# Patient Record
Sex: Female | Born: 2000 | Race: White | Hispanic: No | Marital: Single | Smoking: Never smoker
Health system: Southern US, Community
[De-identification: ages and names within clinical notes are randomized; demographics above are authoritative.]

## PROBLEM LIST (undated history)

## (undated) DIAGNOSIS — R109 Unspecified abdominal pain: Secondary | ICD-10-CM

## (undated) DIAGNOSIS — J45909 Unspecified asthma, uncomplicated: Secondary | ICD-10-CM

## (undated) DIAGNOSIS — Z8489 Family history of other specified conditions: Secondary | ICD-10-CM

## (undated) DIAGNOSIS — R51 Headache: Secondary | ICD-10-CM

## (undated) DIAGNOSIS — Z9889 Other specified postprocedural states: Secondary | ICD-10-CM

## (undated) DIAGNOSIS — S42309A Unspecified fracture of shaft of humerus, unspecified arm, initial encounter for closed fracture: Secondary | ICD-10-CM

## (undated) DIAGNOSIS — R112 Nausea with vomiting, unspecified: Secondary | ICD-10-CM

## (undated) DIAGNOSIS — J189 Pneumonia, unspecified organism: Secondary | ICD-10-CM

## (undated) DIAGNOSIS — N211 Calculus in urethra: Secondary | ICD-10-CM

## (undated) DIAGNOSIS — K625 Hemorrhage of anus and rectum: Secondary | ICD-10-CM

## (undated) DIAGNOSIS — R519 Headache, unspecified: Secondary | ICD-10-CM

## (undated) HISTORY — PX: BRONCHOSCOPY: SUR163

## (undated) HISTORY — PX: WISDOM TOOTH EXTRACTION: SHX21

---
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
## 2019-01-22 DIAGNOSIS — N200 Calculus of kidney: Secondary | ICD-10-CM | POA: Diagnosis not present

## 2020-09-06 IMAGING — CT CT ABD-PELV W/ CM
2 of 4 series · 15 of 46 positions shown, 17 images · IV contrast (APPLIED)
Comparison: Pelvic and appendix ultrasound 3 hours prior.

CLINICAL DATA: Nausea, vomiting, and right lower quadrant abdominal
pain.

EXAM:
CT ABDOMEN AND PELVIS WITH CONTRAST
TECHNIQUE: Multidetector CT imaging of the abdomen and pelvis was performed
using the standard protocol following bolus administration of
intravenous contrast.
CONTRAST:  100mL OMNIPAQUE IOHEXOL 300 MG/ML  SOLN

[Series 3: abdomen 5.0 · axial · 0.69mm/px · z∈[+744,+1159]mm · 12 of 93 slices shown, 14 images]
[im 5/93  soft-tissue]
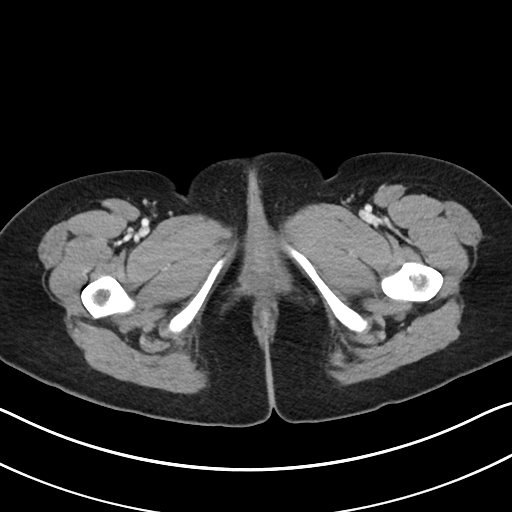
[im 5/93  bone]
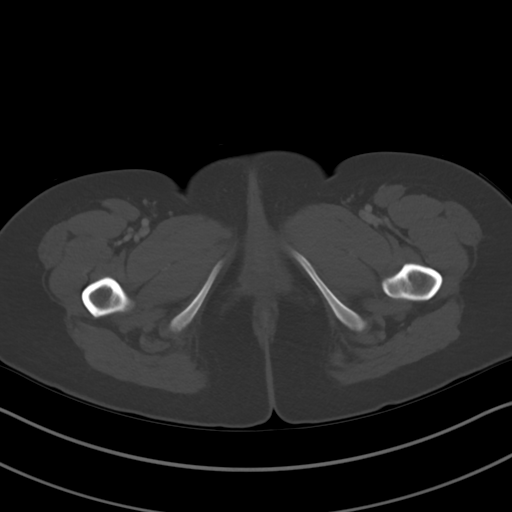
[im 14/93  soft-tissue]
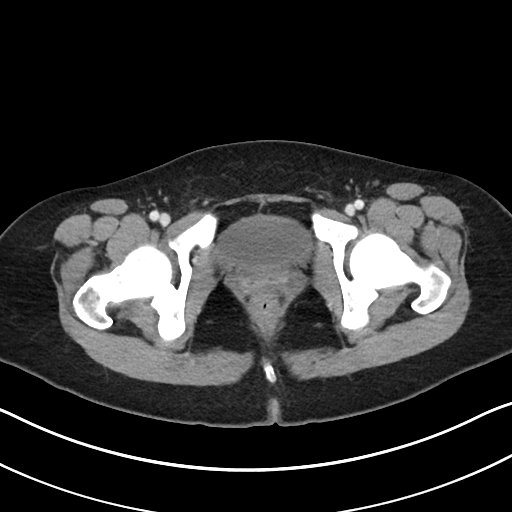
[im 22/93  soft-tissue]
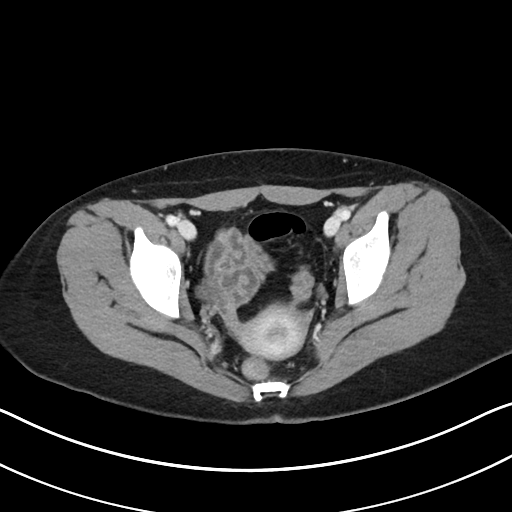
[im 27/93  soft-tissue]
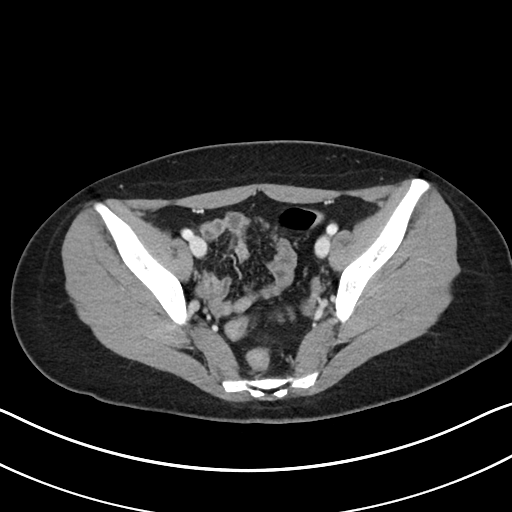
[im 36/93  soft-tissue]
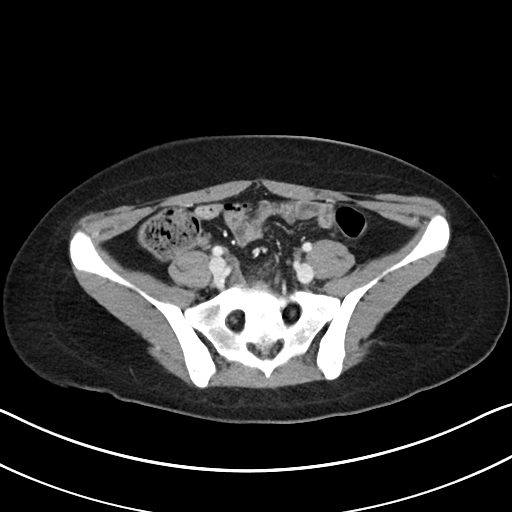
[im 44/93  soft-tissue]
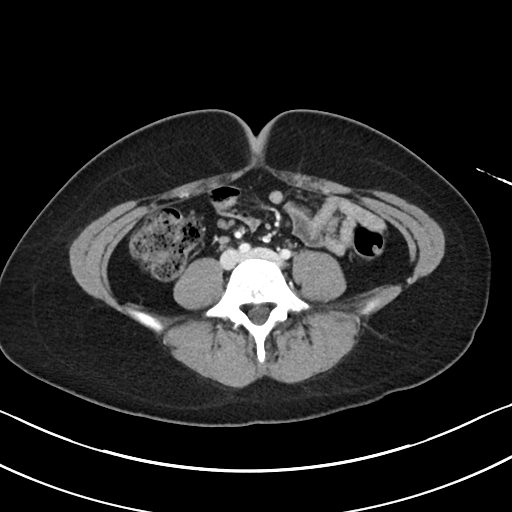
[im 49/93  soft-tissue]
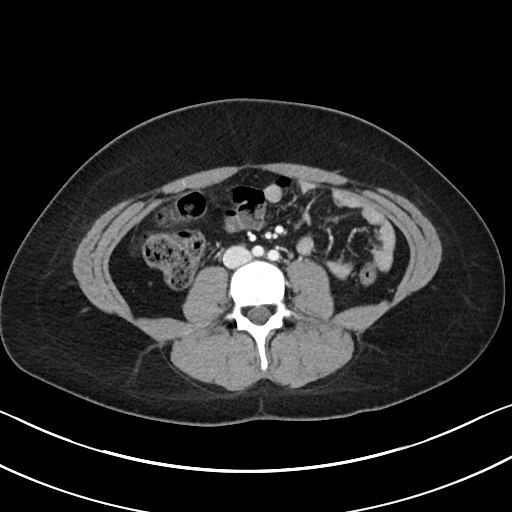
[im 57/93  soft-tissue]
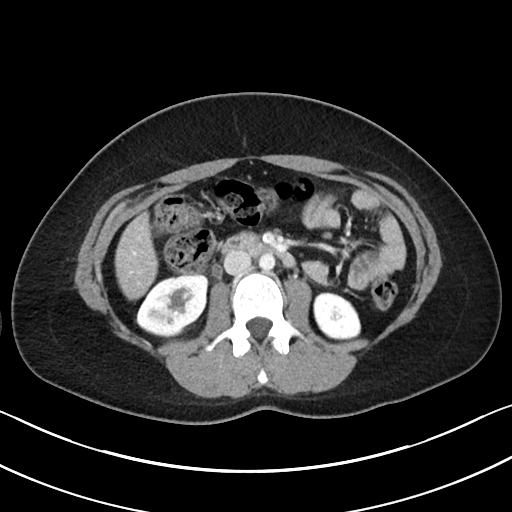
[im 66/93  soft-tissue]
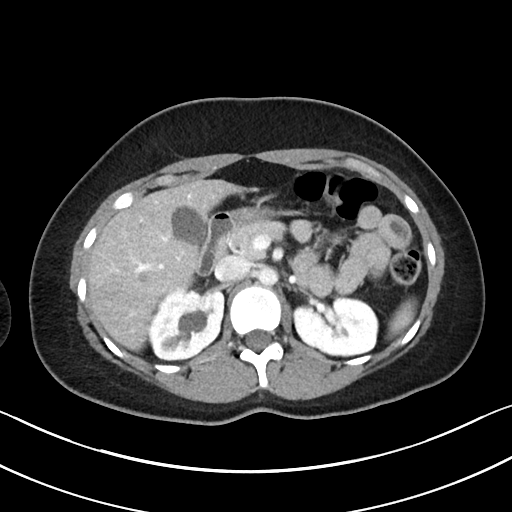
[im 66/93  bone]
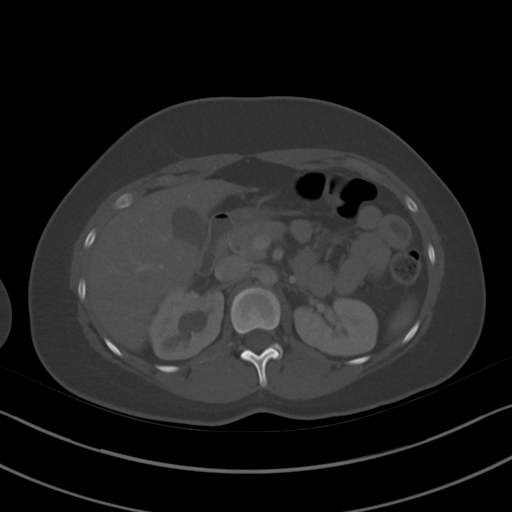
[im 71/93  soft-tissue]
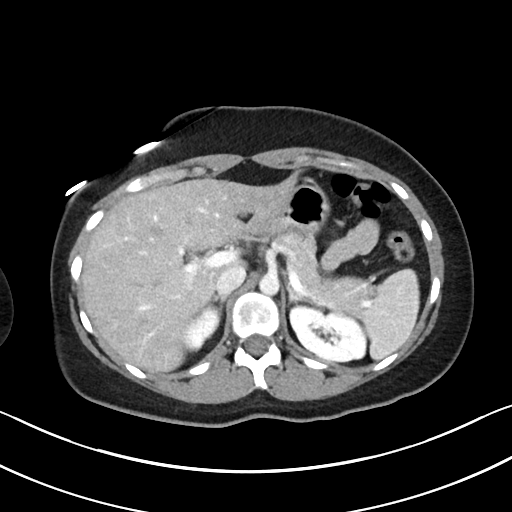
[im 79/93  soft-tissue]
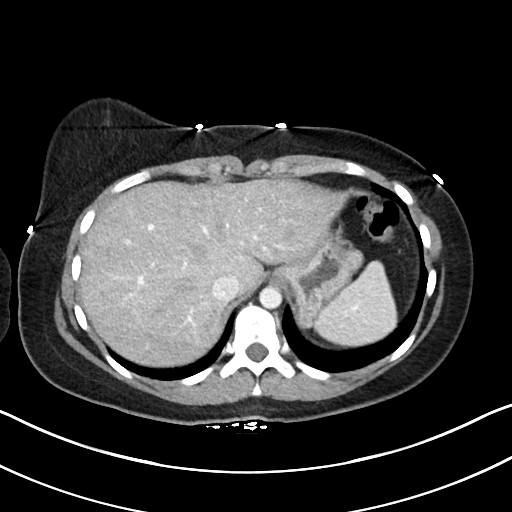
[im 88/93  soft-tissue]
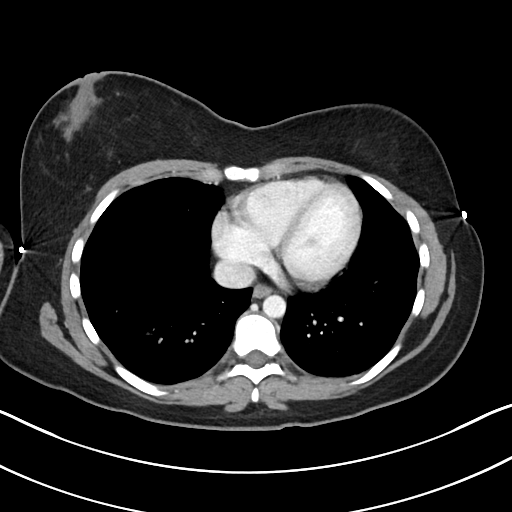

[Series 6: abdomen 3.0 mpr cor · coronal · 0.62mm/px · 3 of 73 slices shown]
[im 25/73  soft-tissue]
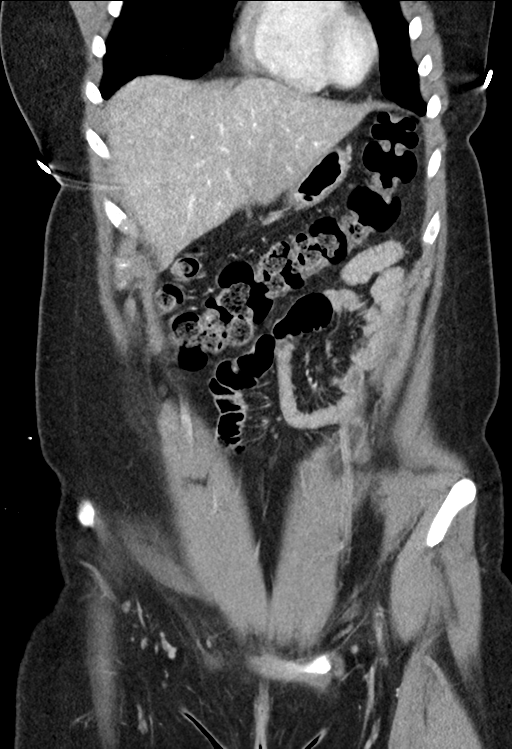
[im 33/73  soft-tissue]
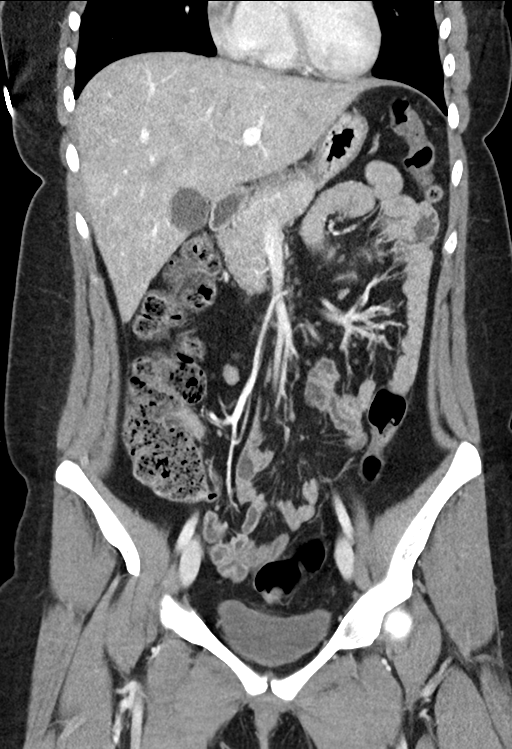
[im 41/73  soft-tissue]
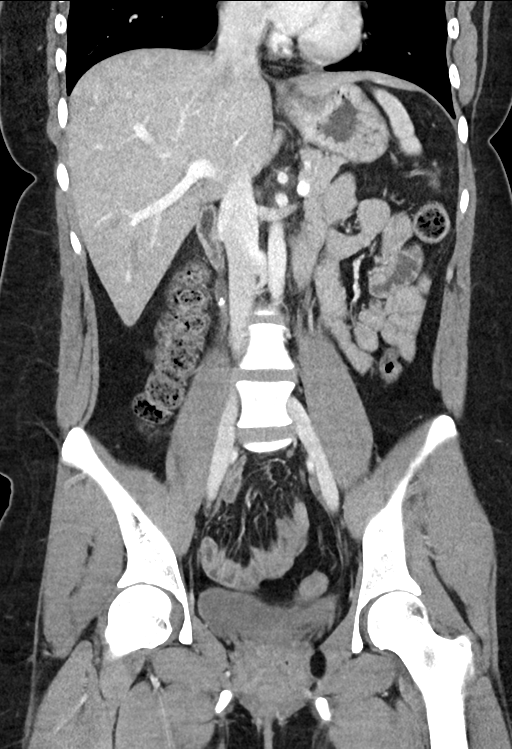

[15 of 46 positions shown; findings below may reference images not displayed]

FINDINGS: Lower chest: The lung bases are clear.

Hepatobiliary: No focal liver abnormality is seen. No gallstones,
gallbladder wall thickening, or biliary dilatation.

Pancreas: No ductal dilatation or inflammation.

Spleen: Normal in size without focal abnormality.

Adrenals/Urinary Tract: Normal adrenal glands. There is an
obstructing 7 x 5 mm stone in the right proximal ureter just beyond
the ureteropelvic junction with mild right hydronephrosis. Only
minimal right perinephric edema. Suspect additional punctate
nonobstructing stone in the right mid kidney. 12 mm cortical cyst in
the mid right kidney. No left hydronephrosis. Multiple small
cortical hypodensities in the left kidney are too small to
accurately characterize but likely cysts. Urinary bladder is
partially distended. No bladder wall thickening or stone.

Stomach/Bowel: Stomach is within normal limits. Normal appendix
measuring 5 mm with air proximally, for example image 62 series 3.
No evidence of bowel wall thickening, distention, or inflammatory
changes.

Vascular/Lymphatic: Circumaortic left renal vein, incidental. No
acute vascular findings. Few prominent ileocolic nodes largest
measuring 9 mm, likely reactive.

Reproductive: Uterus and bilateral adnexa are unremarkable. Both
ovaries are visualized and unremarkable.

Other: No free air free fluid. Free fluid on ultrasound is not
definitively visualized by CT. No intra-abdominal abscess.

Musculoskeletal: Normal.
IMPRESSION: 1. Obstructing 5 x 7 mm stone in the proximal right ureter with mild
hydronephrosis.
2. Normal appendix.

## 2024-02-18 DIAGNOSIS — Z419 Encounter for procedure for purposes other than remedying health state, unspecified: Secondary | ICD-10-CM | POA: Diagnosis not present

## 2024-03-19 DIAGNOSIS — Z419 Encounter for procedure for purposes other than remedying health state, unspecified: Secondary | ICD-10-CM | POA: Diagnosis not present

## 2024-04-19 DIAGNOSIS — Z419 Encounter for procedure for purposes other than remedying health state, unspecified: Secondary | ICD-10-CM | POA: Diagnosis not present

## 2024-05-19 DIAGNOSIS — Z419 Encounter for procedure for purposes other than remedying health state, unspecified: Secondary | ICD-10-CM | POA: Diagnosis not present
